# Patient Record
Sex: Male | Born: 1975 | Race: White | Hispanic: No | Marital: Single | Smoking: Never smoker
Health system: Southern US, Community
[De-identification: ages and names within clinical notes are randomized; demographics above are authoritative.]

## PROBLEM LIST (undated history)

## (undated) DIAGNOSIS — F419 Anxiety disorder, unspecified: Secondary | ICD-10-CM

## (undated) HISTORY — PX: KNEE SURGERY: SHX244

## (undated) HISTORY — PX: CLAVICLE SURGERY: SHX598

## (undated) HISTORY — PX: NASAL SEPTUM SURGERY: SHX37

---

## 2008-05-21 ENCOUNTER — Emergency Department (HOSPITAL_COMMUNITY): Admission: EM | Admit: 2008-05-21 | Discharge: 2008-05-21 | Payer: Self-pay | Admitting: Emergency Medicine

## 2011-01-14 ENCOUNTER — Emergency Department (HOSPITAL_COMMUNITY): Payer: Self-pay

## 2011-01-14 ENCOUNTER — Emergency Department (HOSPITAL_COMMUNITY)
Admission: EM | Admit: 2011-01-14 | Discharge: 2011-01-14 | Disposition: A | Discharge status: Home / Self Care | Payer: No Typology Code available for payment source | Attending: Emergency Medicine | Admitting: Emergency Medicine

## 2011-01-14 ENCOUNTER — Encounter (HOSPITAL_COMMUNITY): Payer: Self-pay | Admitting: *Deleted

## 2011-01-14 ENCOUNTER — Emergency Department (HOSPITAL_COMMUNITY): Payer: No Typology Code available for payment source

## 2011-01-14 DIAGNOSIS — M545 Low back pain, unspecified: Secondary | ICD-10-CM | POA: Insufficient documentation

## 2011-01-14 DIAGNOSIS — S335XXA Sprain of ligaments of lumbar spine, initial encounter: Secondary | ICD-10-CM | POA: Insufficient documentation

## 2011-01-14 DIAGNOSIS — S139XXA Sprain of joints and ligaments of unspecified parts of neck, initial encounter: Secondary | ICD-10-CM | POA: Insufficient documentation

## 2011-01-14 DIAGNOSIS — S161XXA Strain of muscle, fascia and tendon at neck level, initial encounter: Secondary | ICD-10-CM

## 2011-01-14 DIAGNOSIS — M542 Cervicalgia: Secondary | ICD-10-CM | POA: Insufficient documentation

## 2011-01-14 DIAGNOSIS — S39012A Strain of muscle, fascia and tendon of lower back, initial encounter: Secondary | ICD-10-CM

## 2011-01-14 DIAGNOSIS — R319 Hematuria, unspecified: Secondary | ICD-10-CM | POA: Insufficient documentation

## 2011-01-14 LAB — URINALYSIS, ROUTINE W REFLEX MICROSCOPIC
Glucose, UA: NEGATIVE mg/dL
Ketones, ur: 15 mg/dL — AB
pH: 6 (ref 5.0–8.0)

## 2011-01-14 LAB — URINE MICROSCOPIC-ADD ON

## 2011-01-14 MED ORDER — OXYCODONE-ACETAMINOPHEN 5-325 MG PO TABS
1.0000 | ORAL_TABLET | Freq: Once | ORAL | Status: AC
  Administered 2011-01-14: 1 via ORAL
  Filled 2011-01-14: qty 1

## 2011-01-14 MED ORDER — CYCLOBENZAPRINE HCL 10 MG PO TABS
10.0000 mg | ORAL_TABLET | Freq: Once | ORAL | Status: AC
  Administered 2011-01-14: 10 mg via ORAL
  Filled 2011-01-14: qty 1

## 2011-01-14 MED ORDER — PERCOCET 5-325 MG PO TABS: 1.0000 | ORAL_TABLET | Freq: Four times a day (QID) | ORAL | Status: AC | PRN

## 2011-01-14 MED ORDER — CYCLOBENZAPRINE HCL 10 MG PO TABS: 10.0000 mg | ORAL_TABLET | Freq: Three times a day (TID) | ORAL | Status: AC | PRN
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
## 2017-10-21 ENCOUNTER — Emergency Department (HOSPITAL_COMMUNITY)
Admission: EM | Admit: 2017-10-21 | Discharge: 2017-10-21 | Disposition: A | Discharge status: Home / Self Care | Payer: Self-pay | Attending: Emergency Medicine | Admitting: Emergency Medicine

## 2017-10-21 ENCOUNTER — Other Ambulatory Visit: Payer: Self-pay

## 2017-10-21 ENCOUNTER — Encounter (HOSPITAL_COMMUNITY): Payer: Self-pay | Admitting: Emergency Medicine

## 2017-10-21 DIAGNOSIS — M7918 Myalgia, other site: Secondary | ICD-10-CM | POA: Insufficient documentation

## 2017-10-21 DIAGNOSIS — Z5321 Procedure and treatment not carried out due to patient leaving prior to being seen by health care provider: Secondary | ICD-10-CM | POA: Insufficient documentation

## 2017-10-21 NOTE — ED Notes (Signed)
Pt left room.  Ask security which way out of ED. MD notified
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
## 2019-03-30 ENCOUNTER — Emergency Department (HOSPITAL_COMMUNITY)
Admission: EM | Admit: 2019-03-30 | Discharge: 2019-03-30 | Disposition: A | Discharge status: Home / Self Care | Payer: Self-pay | Attending: Emergency Medicine | Admitting: Emergency Medicine

## 2019-03-30 ENCOUNTER — Emergency Department (HOSPITAL_COMMUNITY): Payer: Self-pay

## 2019-03-30 ENCOUNTER — Encounter (HOSPITAL_COMMUNITY): Payer: Self-pay

## 2019-03-30 ENCOUNTER — Other Ambulatory Visit: Payer: Self-pay

## 2019-03-30 DIAGNOSIS — Z23 Encounter for immunization: Secondary | ICD-10-CM | POA: Insufficient documentation

## 2019-03-30 DIAGNOSIS — R0789 Other chest pain: Secondary | ICD-10-CM | POA: Insufficient documentation

## 2019-03-30 DIAGNOSIS — Y92410 Unspecified street and highway as the place of occurrence of the external cause: Secondary | ICD-10-CM | POA: Insufficient documentation

## 2019-03-30 DIAGNOSIS — R519 Headache, unspecified: Secondary | ICD-10-CM | POA: Diagnosis not present

## 2019-03-30 DIAGNOSIS — R109 Unspecified abdominal pain: Secondary | ICD-10-CM | POA: Insufficient documentation

## 2019-03-30 DIAGNOSIS — Y999 Unspecified external cause status: Secondary | ICD-10-CM | POA: Diagnosis not present

## 2019-03-30 DIAGNOSIS — S30811A Abrasion of abdominal wall, initial encounter: Secondary | ICD-10-CM | POA: Insufficient documentation

## 2019-03-30 DIAGNOSIS — M25511 Pain in right shoulder: Secondary | ICD-10-CM | POA: Diagnosis not present

## 2019-03-30 DIAGNOSIS — Y9301 Activity, walking, marching and hiking: Secondary | ICD-10-CM | POA: Insufficient documentation

## 2019-03-30 DIAGNOSIS — T07XXXA Unspecified multiple injuries, initial encounter: Secondary | ICD-10-CM

## 2019-03-30 HISTORY — DX: Anxiety disorder, unspecified: F41.9

## 2019-03-30 LAB — CBC WITH DIFFERENTIAL/PLATELET
Abs Immature Granulocytes: 0.04 10*3/uL (ref 0.00–0.07)
Basophils Absolute: 0 10*3/uL (ref 0.0–0.1)
Basophils Relative: 0 %
Eosinophils Absolute: 0 10*3/uL (ref 0.0–0.5)
Eosinophils Relative: 0 %
HCT: 39.5 % (ref 39.0–52.0)
Hemoglobin: 13.5 g/dL (ref 13.0–17.0)
Immature Granulocytes: 0 %
Lymphocytes Relative: 7 %
Lymphs Abs: 0.9 10*3/uL (ref 0.7–4.0)
MCH: 31.8 pg (ref 26.0–34.0)
MCHC: 34.2 g/dL (ref 30.0–36.0)
MCV: 93.2 fL (ref 80.0–100.0)
Monocytes Absolute: 0.7 10*3/uL (ref 0.1–1.0)
Monocytes Relative: 6 %
Neutro Abs: 10.8 10*3/uL — ABNORMAL HIGH (ref 1.7–7.7)
Neutrophils Relative %: 87 %
Platelets: 292 10*3/uL (ref 150–400)
RBC: 4.24 MIL/uL (ref 4.22–5.81)
RDW: 12.4 % (ref 11.5–15.5)
WBC: 12.5 10*3/uL — ABNORMAL HIGH (ref 4.0–10.5)
nRBC: 0 % (ref 0.0–0.2)

## 2019-03-30 LAB — COMPREHENSIVE METABOLIC PANEL
ALT: 51 U/L — ABNORMAL HIGH (ref 0–44)
AST: 56 U/L — ABNORMAL HIGH (ref 15–41)
Albumin: 4.3 g/dL (ref 3.5–5.0)
Alkaline Phosphatase: 29 U/L — ABNORMAL LOW (ref 38–126)
Anion gap: 13 (ref 5–15)
BUN: 32 mg/dL — ABNORMAL HIGH (ref 6–20)
CO2: 22 mmol/L (ref 22–32)
Calcium: 8.6 mg/dL — ABNORMAL LOW (ref 8.9–10.3)
Chloride: 100 mmol/L (ref 98–111)
Creatinine, Ser: 0.88 mg/dL (ref 0.61–1.24)
GFR calc Af Amer: >60 mL/min (ref >60)
GFR calc non Af Amer: >60 mL/min (ref >60)
Glucose, Bld: 79 mg/dL (ref 70–99)
Potassium: 3.5 mmol/L (ref 3.5–5.1)
Sodium: 135 mmol/L (ref 135–145)
Total Bilirubin: 1.2 mg/dL (ref 0.3–1.2)
Total Protein: 7.1 g/dL (ref 6.5–8.1)

## 2019-03-30 LAB — ETHANOL: Alcohol, Ethyl (B): <10 mg/dL (ref <10)

## 2019-03-30 MED ORDER — IOHEXOL 300 MG/ML  SOLN
100.0000 mL | Freq: Once | INTRAMUSCULAR | Status: AC | PRN
  Administered 2019-03-30: 100 mL via INTRAVENOUS

## 2019-03-30 MED ORDER — TETANUS-DIPHTH-ACELL PERTUSSIS 5-2.5-18.5 LF-MCG/0.5 IM SUSP
0.5000 mL | Freq: Once | INTRAMUSCULAR | Status: AC
Start: 2019-03-30 — End: 2019-03-30
  Administered 2019-03-30: 11:00:00 0.5 mL via INTRAMUSCULAR
  Filled 2019-03-30: qty 0.5
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
